# Patient Record
Sex: Male | Born: 1995 | Race: Black or African American | Hispanic: No | Marital: Single | Smoking: Never smoker
Health system: Southern US, Community
[De-identification: ages and names within clinical notes are randomized; demographics above are authoritative.]

## PROBLEM LIST (undated history)

## (undated) DIAGNOSIS — T7840XA Allergy, unspecified, initial encounter: Secondary | ICD-10-CM

## (undated) HISTORY — PX: COSMETIC SURGERY: SHX468

## (undated) HISTORY — DX: Allergy, unspecified, initial encounter: T78.40XA

---
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
## 2013-01-12 ENCOUNTER — Ambulatory Visit (INDEPENDENT_AMBULATORY_CARE_PROVIDER_SITE_OTHER): Payer: Federal, State, Local not specified - PPO | Admitting: Emergency Medicine

## 2013-01-12 VITALS — BP 120/64 | HR 53 | Temp 98.3°F | Resp 16 | Ht 67.0 in | Wt 140.0 lb

## 2013-01-12 DIAGNOSIS — S83206A Unspecified tear of unspecified meniscus, current injury, right knee, initial encounter: Secondary | ICD-10-CM

## 2013-01-12 DIAGNOSIS — IMO0002 Reserved for concepts with insufficient information to code with codable children: Secondary | ICD-10-CM

## 2013-01-12 DIAGNOSIS — J309 Allergic rhinitis, unspecified: Secondary | ICD-10-CM

## 2013-01-12 MED ORDER — OLOPATADINE HCL 0.1 % OP SOLN: 1.0000 [drp] | Freq: Two times a day (BID) | OPHTHALMIC | Status: DC

## 2013-01-12 MED ORDER — FLUTICASONE PROPIONATE 50 MCG/ACT NA SUSP: 4.0000 | Freq: Every day | NASAL | Status: DC

## 2013-01-12 NOTE — Progress Notes (Signed)
[facility] [address], [place] [address] [phone]  Date:  01/12/2013   Name:  Jeffery Murphy   DOB:  April 26, 1996   MRN:  [id number]  PCP:  No PCP Per Patient    Chief Complaint: Knee Pain and Eye Problem   History of Present Illness:  Jeffery Murphy is a 17 y.o. very pleasant male patient who presents with the following:  Injured his right knee and has experienced locking and clicking since September.  No overuse.  Has no swelling or bruising now.  No pain with ambulation.  Continues to skateboard.  Has nasal congestion and drainage that is mucoid as well as itching and watering in right eye.  No gluing.  No improvement with over the counter medications or other home remedies. Denies other complaint or health concern today.   There are no active problems to display for this patient.   History reviewed. No pertinent past medical history.  History reviewed. No pertinent past surgical history.  History  Substance Use Topics  . Smoking status: Never Smoker   . Smokeless tobacco: Not on file  . Alcohol Use: No    Family History  Problem Relation Age of Onset  . Diabetes Mother   . Asthma Father   . Asthma Brother     No Known Allergies  Medication list has been reviewed and updated.  No current outpatient prescriptions on file prior to visit.   No current facility-administered medications on file prior to visit.    Review of Systems:  As per HPI, otherwise negative.    Physical Examination: Filed Vitals:   01/12/13 1416  BP: 120/64  Pulse: 53  Temp: 98.3 F (36.8 C)  Resp: 16   Filed Vitals:   01/12/13 1416  Height: 5\' 7"  (1.702 m)  Weight: 140 lb (63.504 kg)   Body mass index is 21.92 kg/(m^2). Ideal Body Weight: Weight in (lb) to have BMI = 25: 159.3   GEN: WDWN, NAD, Non-toxic, Alert & Oriented x 3 HEENT: Atraumatic, Normocephalic. Conjunctiva clear.  No drainage or marginal exudate Ears and Nose: No external  deformity. EXTR: No clubbing/cyanosis/edema NEURO: Normal gait.  PSYCH: Normally interactive. Conversant. Not depressed or anxious appearing.  Calm demeanor.  Right knee:  jont stable.  No effusion.  No tenderness   Assessment and Plan: Allergic conjunctivitis Meniscus tear. Ortho referal flonase patanol   Signed,  Phillips Odor, MD

## 2013-01-12 NOTE — Patient Instructions (Addendum)

## 2013-03-13 ENCOUNTER — Ambulatory Visit (INDEPENDENT_AMBULATORY_CARE_PROVIDER_SITE_OTHER): Payer: Federal, State, Local not specified - PPO | Admitting: Family Medicine

## 2013-03-13 ENCOUNTER — Ambulatory Visit: Payer: Federal, State, Local not specified - PPO

## 2013-03-13 DIAGNOSIS — M79609 Pain in unspecified limb: Secondary | ICD-10-CM

## 2013-03-13 MED ORDER — ACETAMINOPHEN-CODEINE #3 300-30 MG PO TABS: 1.0000 | ORAL_TABLET | Freq: Four times a day (QID) | ORAL | Status: DC | PRN

## 2013-03-13 NOTE — Patient Instructions (Addendum)
Wear the splint as needed to protect your thumb.  If your pain is not better in the next couple of days please let me know- Sooner if worse.   The radiologist will also check your x-ray.  If they see any other problem I will call you right away  Keep your thumb clean and dry until tomorrow- then you can bathe as usual  Use the tylenol with codeine if needed for pain- remember it can make you sleepy

## 2013-03-13 NOTE — Progress Notes (Signed)
[facility] [address] [place] [address] [phone]  Date:  03/13/2013   Name:  Jeffery Murphy   DOB:  1995/09/29   MRN:  [id number]  PCP:  No PCP Per Patient    Chief Complaint: Hand Pain   History of Present Illness:  Jeffery Murphy is a 17 y.o. very pleasant male patient who presents with the following:  Shut his right thumb in a car sunroof last night.  He has pain and swelling, some blood under the thumb nail.   He is generally healthy Otherwise he is unhurt.  He notes that the fingers of his right hand feel a little tingly, but he did not injure these fingers.    No other medications.    He exercises quite a bit- plays sports and runs a lot Per pt and his father he was up most of the night last night with pain- he would like to have a stronger pain medication to have if needed   There are no active problems to display for this patient.   No past medical history on file.  No past surgical history on file.  History  Substance Use Topics  . Smoking status: Never Smoker   . Smokeless tobacco: Not on file  . Alcohol Use: No    Family History  Problem Relation Age of Onset  . Diabetes Mother   . Asthma Father   . Asthma Brother     No Known Allergies  Medication list has been reviewed and updated.  Current Outpatient Prescriptions on File Prior to Visit  Medication Sig Dispense Refill  . fluticasone (FLONASE) 50 MCG/ACT nasal spray Place 4 sprays into the nose daily.  16 g  12  . olopatadine (PATANOL) 0.1 % ophthalmic solution Place 1 drop into both eyes 2 (two) times daily.  5 mL  12   No current facility-administered medications on file prior to visit.    Review of Systems:  As per HPI- otherwise negative.   Physical Examination: Filed Vitals:   03/13/13 1227  BP: 106/56  Pulse: 47  Temp: 97.9 F (36.6 C)  Resp: 16   Filed Vitals:   03/13/13 1227  Height: 5' 6.25" (1.683 m)  Weight: 133 lb 6.4 oz (60.51 kg)   Body  mass index is 21.36 kg/(m^2). Ideal Body Weight: Weight in (lb) to have BMI = 25: 155.7  GEN: WDWN, NAD, Non-toxic, A & O x 3, generally healthy HEENT: Atraumatic, Normocephalic. Neck supple. No masses, No LAD.   Ears and Nose: No external deformity. CV: RRR, No M/G/R. No JVD. No thrill. No extra heart sounds. PULM: CTA B, no wheezes, crackles, rhonchi. No retractions. No resp. distress. No accessory muscle use. ABD: S, NT, ND, +BS. No rebound. No HSM. EXTR: No c/c/e NEURO Normal gait.  PSYCH: Normally interactive. Conversant. Not depressed or anxious appearing.  Calm demeanor.  Right hand: he has a 1/2 to 2/3 subungal hematoma of the right thumbnail.  The thumb is slightly swollen, but has normal ROM, normal flexion and extension, normal opposition Other fingers are normal, normal cap refill.   [facility] reading (PRIMARY) by  Dr. Patsy Lager. Right hand (thumb injury): no fracture noted  RIGHT HAND - COMPLETE 3+ VIEW  Comparison: None  Findings: None Osseous mineralization normal. Joint spaces preserved. No acute fracture, dislocation or bone destruction. Soft tissue swelling at right thumb.  IMPRESSION: No acute osseous abnormalities.  VC obtained.  Right thumbnail prepped with betadine.  Drained subungal hematoma with hand- held electrocautery.  No complications, pt tolerated well.  Placed in fold- over splint for protection    Assessment and Plan: Pain, hand, right - Plan: DG Hand Complete Right, acetaminophen-codeine (TYLENOL #3) 300-30 MG per tablet  subungual hematoma drained as above.  Placed in a fold- over splint for protection.  Let me know if not better in the next few days  Signed Abbe Amsterdam, MD

## 2013-05-04 ENCOUNTER — Ambulatory Visit (INDEPENDENT_AMBULATORY_CARE_PROVIDER_SITE_OTHER): Payer: Federal, State, Local not specified - PPO | Admitting: Physician Assistant

## 2013-05-04 VITALS — BP 112/72 | HR 78 | Temp 98.1°F | Resp 16 | Ht 66.0 in | Wt 131.2 lb

## 2013-05-04 DIAGNOSIS — R319 Hematuria, unspecified: Secondary | ICD-10-CM

## 2013-05-04 DIAGNOSIS — R350 Frequency of micturition: Secondary | ICD-10-CM

## 2013-05-04 LAB — POCT CBC
Granulocyte percent: 68.1 %G (ref 37–80)
Lymph, poc: 2.3 (ref 0.6–3.4)
MCV: 96.7 fL (ref 80–97)
POC MID %: 7.4 %M (ref 0–12)
Platelet Count, POC: 230 10*3/uL (ref 142–424)
RDW, POC: 14 %
WBC: 9.3 10*3/uL (ref 4.6–10.2)

## 2013-05-04 LAB — POCT URINALYSIS DIPSTICK
Glucose, UA: NEGATIVE
Nitrite, UA: NEGATIVE
Protein, UA: 100
Spec Grav, UA: >=1.03
Urobilinogen, UA: 0.2

## 2013-05-04 LAB — POCT UA - MICROSCOPIC ONLY
Bacteria, U Microscopic: NEGATIVE
Casts, Ur, LPF, POC: NEGATIVE
Crystals, Ur, HPF, POC: NEGATIVE
Yeast, UA: NEGATIVE

## 2013-05-04 LAB — GLUCOSE, POCT (MANUAL RESULT ENTRY): POC Glucose: 95 mg/dl (ref 70–99)

## 2013-05-04 MED ORDER — SULFAMETHOXAZOLE-TRIMETHOPRIM 800-160 MG PO TABS: 1.0000 | ORAL_TABLET | Freq: Two times a day (BID) | ORAL | Status: AC

## 2013-05-04 NOTE — Progress Notes (Signed)
Subjective:    Patient ID: Jeffery Murphy, male    DOB: 1996/06/17, 17 y.o.   MRN: [id number]  HPI  This 17 y.o. male presents for evaluation of dysuria and hematuria. Began 1-2 days ago, and had HA and chills. Urinary urgency and frequency. No penile discharge. Is not now, and never has been sexually active, including oral sex.  Previously similar symptoms about a year ago.  Used cranberry juice with resolution.  This time the symptoms are worse.    No nausea, vomiting or diarrhea. No myalgias, arthralgias or rash.  No abdominal, flank or back pain.  Review of Systems As above.    Objective:   Physical Exam Blood pressure 112/72, pulse 78, temperature 98.1 F (36.7 C), temperature source Oral, resp. rate 16, height 5\' 6"  (1.676 m), weight 131 lb 3.2 oz (59.512 kg), SpO2 100.00%. Body mass index is 21.19 kg/(m^2). Well-developed, well nourished BM who is awake, alert and oriented, in NAD. HEENT: Ward/AT, sclera and conjunctiva are clear.   Neck: supple, non-tender, no lymphadenopathy, thyromegaly. Heart: RRR, no murmur Lungs: normal effort, CTA Abdomen: normo-active bowel sounds, supple, non-tender, no mass or organomegaly. No CVA tenderness. No inguinal lymphadenopathy. Extremities: no cyanosis, clubbing or edema. Back:  Non-tender spine and paraspinous muscles. Skin: warm and dry without rash. Psychologic: good mood and appropriate affect, normal speech and behavior.    Results for orders placed in visit on 05/04/13  POCT UA - MICROSCOPIC ONLY      Result Value Range   WBC, Ur, HPF, POC 8-12     RBC, urine, microscopic tntc     Bacteria, U Microscopic neg     Mucus, UA neg     Epithelial cells, urine per micros 1-4     Crystals, Ur, HPF, POC neg     Casts, Ur, LPF, POC neg     Yeast, UA neg    POCT URINALYSIS DIPSTICK      Result Value Range   Color, UA yellow     Clarity, UA hazy     Glucose, UA neg     Bilirubin, UA neg     Ketones, UA trace     Spec Grav, UA >=1.030      Blood, UA large     pH, UA 6.0     Protein, UA 100     Urobilinogen, UA 0.2     Nitrite, UA neg     Leukocytes, UA small (1+)    POCT CBC      Result Value Range   WBC 9.3  4.6 - 10.2 K/uL   Lymph, poc 2.3  0.6 - 3.4   POC LYMPH PERCENT 24.5  10 - 50 %L   MID (cbc) 0.7  0 - 0.9   POC MID % 7.4  0 - 12 %M   POC Granulocyte 6.3  2 - 6.9   Granulocyte percent 68.1  37 - 80 %G   RBC 4.78  4.69 - 6.13 M/uL   Hemoglobin 14.8  14.1 - 18.1 g/dL   HCT, POC 56.2  13.0 - 53.7 %   MCV 96.7  80 - 97 fL   MCH, POC 31.0  27 - 31.2 pg   MCHC 32.0  31.8 - 35.4 g/dL   RDW, POC 86.5     Platelet Count, POC 230  142 - 424 K/uL   MPV 8.8  0 - 99.8 fL  GLUCOSE, POCT (MANUAL RESULT ENTRY)      Result Value Range  POC Glucose 95  70 - 99 mg/dl       Assessment & Plan:  Hematuria - Plan: POCT UA - Microscopic Only, POCT urinalysis dipstick, POCT CBC, Comprehensive metabolic panel, Urine culture  Frequent urination - Plan: POCT UA - Microscopic Only, POCT urinalysis dipstick, POCT glucose (manual entry), sulfamethoxazole-trimethoprim (BACTRIM DS,SEPTRA DS) 800-160 MG per tablet  Possible nephrolithiasis, but no back, flank or abdominal pain.  Treat for prostatitis, pending lab results. Rest, fluids.  RTC if symptoms not significantly improved in 48 hours, sooner if worsening.  Fernande Bras, PA-C Physician Assistant-Certified [facility]

## 2013-05-04 NOTE — Patient Instructions (Signed)
Get plenty of rest and drink at least 64 ounces of water daily.  I will contact you with your lab results as soon as they are available.   If you have not heard from me in 2 weeks, please contact me.  The fastest way to get your results is to register for My Chart (see the instructions on the last page of this printout).   

## 2013-05-05 LAB — COMPREHENSIVE METABOLIC PANEL
AST: 12 U/L (ref 0–37)
Albumin: 4.7 g/dL (ref 3.5–5.2)
Alkaline Phosphatase: 114 U/L (ref 39–117)
BUN: 15 mg/dL (ref 6–23)
Potassium: 4 mEq/L (ref 3.5–5.3)
Total Bilirubin: 2.7 mg/dL — ABNORMAL HIGH (ref 0.3–1.2)

## 2013-05-07 LAB — URINE CULTURE

## 2013-06-07 ENCOUNTER — Ambulatory Visit: Payer: Federal, State, Local not specified - PPO

## 2013-06-07 ENCOUNTER — Ambulatory Visit (INDEPENDENT_AMBULATORY_CARE_PROVIDER_SITE_OTHER): Payer: Federal, State, Local not specified - PPO | Admitting: Internal Medicine

## 2013-06-07 VITALS — BP 100/60 | HR 46 | Temp 98.5°F | Resp 16 | Ht 66.25 in | Wt 133.0 lb

## 2013-06-07 DIAGNOSIS — M25562 Pain in left knee: Secondary | ICD-10-CM

## 2013-06-07 DIAGNOSIS — M25569 Pain in unspecified knee: Secondary | ICD-10-CM

## 2013-06-07 DIAGNOSIS — IMO0002 Reserved for concepts with insufficient information to code with codable children: Secondary | ICD-10-CM

## 2013-06-07 DIAGNOSIS — S86912A Strain of unspecified muscle(s) and tendon(s) at lower leg level, left leg, initial encounter: Secondary | ICD-10-CM

## 2013-06-07 NOTE — Progress Notes (Signed)
  Subjective:    Patient ID: Jeffery Murphy, male    DOB: 10/27/1995, 17 y.o.   MRN: [id number]  HPI Skate boarder, left knee unable to jump, no pain. No remembered injury. No swelling except for osgood schlatters at tibial tuberosity. He states left leg is not as explosives as it used to be skateboarding when he jumps.   Review of Systems     Objective:   Physical Exam  Constitutional: He is oriented to person, place, and time. He appears well-developed and well-nourished.  HENT:  Head: Normocephalic.  Eyes: EOM are normal.  Cardiovascular: Normal rate.   Pulmonary/Chest: Effort normal.  Musculoskeletal:       Left knee: He exhibits normal range of motion, no swelling, no effusion, no ecchymosis, no deformity, no laceration, no erythema, normal alignment, no LCL laxity, normal patellar mobility, no bony tenderness, normal meniscus and no MCL laxity. No tenderness found. No medial joint line, no lateral joint line, no MCL and no LCL tenderness noted.  Neurological: He is alert and oriented to person, place, and time. He has normal strength and normal reflexes. No cranial nerve deficit or sensory deficit. He displays a negative Romberg sign.  Skin:     Prominent tibial tuberosity, not tender  Squats and rises no weakness or pain   [facility] reading (PRIMARY) by  Dr.Ahren Pettinger       Assessment & Plan:  Normal knee and leg exam Exercises to strenthen

## 2013-06-07 NOTE — Progress Notes (Signed)
  Subjective:    Patient ID: Jeffery Murphy, male    DOB: 18-Jun-1996, 17 y.o.   MRN: [id number]  HPI Patient comes in today with left knee pain with jumping. When he skateboards he is unable to jump due to pain. He does feel like his left knee gives out a little bit. Has never had a injury to the left knee but has noticed a lump.    Review of Systems     Objective:   Physical Exam        Assessment & Plan:

## 2013-06-07 NOTE — Patient Instructions (Addendum)
Knee Exercises EXERCISES RANGE OF MOTION(ROM) AND STRETCHING EXERCISES These exercises may help you when beginning to rehabilitate your injury. Your symptoms may resolve with or without further involvement from your physician, physical therapist or athletic trainer. While completing these exercises, remember:   Restoring tissue flexibility helps normal motion to return to the joints. This allows healthier, less painful movement and activity.  An effective stretch should be held for at least 30 seconds.  A stretch should never be painful. You should only feel a gentle lengthening or release in the stretched tissue. STRETCH - Knee Extension, Prone  Lie on your stomach on a firm surface, such as a bed or countertop. Place your right / left knee and leg just beyond the edge of the surface. You may wish to place a towel under the far end of your right / left thigh for comfort.  Relax your leg muscles and allow gravity to straighten your knee. Your clinician may advise you to add an ankle weight if more resistance is helpful for you.  You should feel a stretch in the back of your right / left knee. Hold this position for __________ seconds. Repeat __________ times. Complete this stretch __________ times per day. * Your physician, physical therapist or athletic trainer may ask you to add ankle weight to enhance your stretch.  RANGE OF MOTION - Knee Flexion, Active  Lie on your back with both knees straight. (If this causes back discomfort, bend your opposite knee, placing your foot flat on the floor.)  Slowly slide your heel back toward your buttocks until you feel a gentle stretch in the front of your knee or thigh.  Hold for __________ seconds. Slowly slide your heel back to the starting position. Repeat __________ times. Complete this exercise __________ times per day.  STRETCH - Quadriceps, Prone   Lie on your stomach on a firm surface, such as a bed or padded floor.  Bend your right /  left knee and grasp your ankle. If you are unable to reach, your ankle or pant leg, use a belt around your foot to lengthen your reach.  Gently pull your heel toward your buttocks. Your knee should not slide out to the side. You should feel a stretch in the front of your thigh and/or knee.  Hold this position for __________ seconds. Repeat __________ times. Complete this stretch __________ times per day.  STRETCH  Hamstrings, Supine   Lie on your back. Loop a belt or towel over the ball of your right / left foot.  Straighten your right / left knee and slowly pull on the belt to raise your leg. Do not allow the right / left knee to bend. Keep your opposite leg flat on the floor.  Raise the leg until you feel a gentle stretch behind your right / left knee or thigh. Hold this position for __________ seconds. Repeat __________ times. Complete this stretch __________ times per day.  STRENGTHENING EXERCISES These exercises may help you when beginning to rehabilitate your injury. They may resolve your symptoms with or without further involvement from your physician, physical therapist or athletic trainer. While completing these exercises, remember:   Muscles can gain both the endurance and the strength needed for everyday activities through controlled exercises.  Complete these exercises as instructed by your physician, physical therapist or athletic trainer. Progress the resistance and repetitions only as guided.  You may experience muscle soreness or fatigue, but the pain or discomfort you are trying to eliminate should   never worsen during these exercises. If this pain does worsen, stop and make certain you are following the directions exactly. If the pain is still present after adjustments, discontinue the exercise until you can discuss the trouble with your clinician. STRENGTH - Quadriceps, Isometrics  Lie on your back with your right / left leg extended and your opposite knee bent.  Gradually  tense the muscles in the front of your right / left thigh. You should see either your knee cap slide up toward your hip or increased dimpling just above the knee. This motion will push the back of the knee down toward the floor/mat/bed on which you are lying.  Hold the muscle as tight as you can without increasing your pain for __________ seconds.  Relax the muscles slowly and completely in between each repetition. Repeat __________ times. Complete this exercise __________ times per day.  STRENGTH - Quadriceps, Short Arcs   Lie on your back. Place a __________ inch towel roll under your knee so that the knee slightly bends.  Raise only your lower leg by tightening the muscles in the front of your thigh. Do not allow your thigh to rise.  Hold this position for __________ seconds. Repeat __________ times. Complete this exercise __________ times per day.  OPTIONAL ANKLE WEIGHTS: Begin with ____________________, but DO NOT exceed ____________________. Increase in 1 pound/0.5 kilogram increments.  STRENGTH - Quadriceps, Straight Leg Raises  Quality counts! Watch for signs that the quadriceps muscle is working to insure you are strengthening the correct muscles and not "cheating" by substituting with healthier muscles.  Lay on your back with your right / left leg extended and your opposite knee bent.  Tense the muscles in the front of your right / left thigh. You should see either your knee cap slide up or increased dimpling just above the knee. Your thigh may even quiver.  Tighten these muscles even more and raise your leg 4 to 6 inches off the floor. Hold for __________ seconds.  Keeping these muscles tense, lower your leg.  Relax the muscles slowly and completely in between each repetition. Repeat __________ times. Complete this exercise __________ times per day.  STRENGTH - Hamstring, Curls  Lay on your stomach with your legs extended. (If you lay on a bed, your feet may hang over the  edge.)  Tighten the muscles in the back of your thigh to bend your right / left knee up to 90 degrees. Keep your hips flat on the bed/floor.  Hold this position for __________ seconds.  Slowly lower your leg back to the starting position. Repeat __________ times. Complete this exercise __________ times per day.  OPTIONAL ANKLE WEIGHTS: Begin with ____________________, but DO NOT exceed ____________________. Increase in 1 pound/0.5 kilogram increments.  STRENGTH  Quadriceps, Squats  Stand in a door frame so that your feet and knees are in line with the frame.  Use your hands for balance, not support, on the frame.  Slowly lower your weight, bending at the hips and knees. Keep your lower legs upright so that they are parallel with the door frame. Squat only within the range that does not increase your knee pain. Never let your hips drop below your knees.  Slowly return upright, pushing with your legs, not pulling with your hands. Repeat __________ times. Complete this exercise __________ times per day.  STRENGTH - Quadriceps, Wall Slides  Follow guidelines for form closely. Increased knee pain often results from poorly placed feet or knees.  Lean against   a smooth wall or door and walk your feet out 18-24 inches. Place your feet hip-width apart.  Slowly slide down the wall or door until your knees bend __________ degrees.* Keep your knees over your heels, not your toes, and in line with your hips, not falling to either side.  Hold for __________ seconds. Stand up to rest for __________ seconds in between each repetition. Repeat __________ times. Complete this exercise __________ times per day. * Your physician, physical therapist or athletic trainer will alter this angle based on your symptoms and progress. Document Released: 06/29/2005 Document Revised: 11/07/2011 Document Reviewed: 11/27/2008 [facility] Patient Information 2014 [place], [place]. Patella Problems (Patellofemoral  Syndrome) This syndrome is caused by changes in the undersurface of the kneecap (patella). The changes vary from minor inflammation to major changes such as breakdown of the cartilage on the undersurface of the patella. The major changes can be seen with an arthroscope (a small, pencil-sized telescope). These changes can result from various factors. These factors may arise from abnormal tracking (movement or malalignment) of the patella. Normally the Patella is in its normal groove located between the condyles (grooved end) of the femur (thigh bone). Abnormal movement leads to increased pressure in the patellofemoral joint. This leads to swelling in the cartilage, inflammation and pain. SYMPTOMS  The patient with this syndrome usually has an ache in the knee. It is often aggravated by:  Prolonged sitting.  Squatting.  Climbing stairs.  Running down [place].  Other exercising that stresses the knee. Other findings may include the knee giving way, swelling, and or locking. TREATMENT  The treatment will depend on the cause of the problem. Sometimes the solution is as simple as cutting down on activities. Giving your joint a rest with the use of crutches and braces can also help. This is generally followed by strengthening exercises. RECOVERY Recovery from a patellar problem depends on the type of problem in your knee and on the treatment required. If conservative treatment works the recovery period may be as little as three to four weeks. If more aggressive therapy such as surgery is required, the recovery period may be several months. Your caregiver will discuss this with you. HOME CARE INSTRUCTIONS  Following exercise, use an ice pack for twenty to thirty minutes three to four times per day. Use a towel between your ice pack and the skin.  Reduction of inflammation with anti-inflammatories may be helpful. Only take over-the-counter or prescription medicines for pain, discomfort, or fever as  directed by your caregiver.  Taping the knee or using a neoprene sleeve with a patellar cutout to provide better tracking of the patella may give relief.  Muscle (quadriceps) strengthening exercises are helpful. Follow your caregiver's advice.  Muscle stretching prior to exercise may be helpful.  Soft tissue therapy using ultrasound, and diathermy may be helpful.  If conservative therapy is not effective, surgery may provide relief. During arthroscopy, your caregiver may discover a rough surface beneath your kneecap. If this happens, your caregiver may smooth this out by shaving the surface. SEEK MEDICAL CARE IF: If you have surgery, see your caregiver if:  There is increased bleeding or clear fluid (more than a small spot) from the wound.  You notice redness, swelling, or increasing pain in the wound.  Pus is coming from wound.  You develop an unexplained oral temperature above 102 F (38.9 C) develops, or as your caregiver suggests.  You notice a foul smell coming from the wound or dressing.  You develop  increasing pain or stiffness in your knee. SEEK IMMEDIATE MEDICAL CARE IF:   You develop a rash.  You have difficulty breathing.  You have any allergic problems. MAKE SURE YOU:   Understand these instructions.  Will watch your condition.  Will get help right away if you are not doing well or get worse. Document Released: 08/12/2000 Document Revised: 11/07/2011 Document Reviewed: 09/01/2008 [facility] Patient Information 2014 [place], [place].

## 2013-06-12 ENCOUNTER — Telehealth: Payer: Self-pay

## 2013-06-12 NOTE — Telephone Encounter (Signed)
Request given to xray 

## 2013-06-12 NOTE — Telephone Encounter (Signed)
PT WOULD LIKE TO COME BY AND P/U A COPY OF HIS XRAYS, NEED BY Monday. PLEASE CALL [phone] WHEN READY

## 2013-09-03 ENCOUNTER — Ambulatory Visit (INDEPENDENT_AMBULATORY_CARE_PROVIDER_SITE_OTHER): Payer: Federal, State, Local not specified - PPO | Admitting: Family Medicine

## 2013-09-03 VITALS — BP 100/60 | HR 78 | Temp 98.4°F | Resp 16 | Ht 67.0 in | Wt 132.0 lb

## 2013-09-03 DIAGNOSIS — J029 Acute pharyngitis, unspecified: Secondary | ICD-10-CM

## 2013-09-03 DIAGNOSIS — R6883 Chills (without fever): Secondary | ICD-10-CM

## 2013-09-03 LAB — POCT RAPID STREP A (OFFICE): Rapid Strep A Screen: NEGATIVE

## 2013-09-03 LAB — POCT INFLUENZA A/B
Influenza A, POC: NEGATIVE
Influenza B, POC: NEGATIVE

## 2013-09-03 NOTE — Progress Notes (Signed)
[facility] [address], [place] [address] [phone]  Date:  09/03/2013   Name:  Jeffery Murphy   DOB:  [birth year]   MRN:  [id number]  PCP:  No PCP Per Patient    Chief Complaint: Chills and Sore Throat   History of Present Illness:  Jeffery Murphy is a 18 y.o. very pleasant male patient who presents with the following:  Here today with illness. He noted a mild ST yesterday, and it seemed to get worse today.   He also had chills last night.   He felt achy today.   They have not noted any fever at home.   No cough, he has noted a stuffy and runny nose.   No sick contacts that he knows of No Gi symptoms    He has taken some cough syrup today.    He is generally healthy .   He works as a Copy.   Bp is usually low- 7/14 106/56, and 05/2013 100/60  There are no active problems to display for this patient.   Past Medical History  Diagnosis Date  . Allergy     Past Surgical History  Procedure Laterality Date  . Cosmetic surgery      jaw repair    History  Substance Use Topics  . Smoking status: Never Smoker   . Smokeless tobacco: Never Used  . Alcohol Use: No    Family History  Problem Relation Age of Onset  . Diabetes Mother   . Asthma Father   . Asthma Brother     No Known Allergies  Medication list has been reviewed and updated.  Current Outpatient Prescriptions on File Prior to Visit  Medication Sig Dispense Refill  . fluticasone (FLONASE) 50 MCG/ACT nasal spray Place 4 sprays into the nose daily.  16 g  12   No current facility-administered medications on file prior to visit.    Review of Systems:  As per HPI- otherwise negative.   Physical Examination: Filed Vitals:   09/03/13 1909  BP: 100/60  Pulse: 78  Temp: 98.4 F (36.9 C)  Resp: 16   Filed Vitals:   09/03/13 1909  Height: 5\' 7"  (1.702 m)  Weight: 132 lb (59.875 kg)   Body mass index is 20.67 kg/(m^2). Ideal Body Weight: Weight in (lb) to have  BMI = 25: 159.3  GEN: WDWN, NAD, Non-toxic, A & O x 3, looks well HEENT: Atraumatic, Normocephalic. Neck supple. No masses, No LAD.  Bilateral TM wnl, oropharynx normal.  PEERL,EOMI.   Ears and Nose: No external deformity. CV: RRR, No M/G/R. No JVD. No thrill. No extra heart sounds. PULM: CTA B, no wheezes, crackles, rhonchi. No retractions. No resp. distress. No accessory muscle use. ABD: S, NT, ND. No rebound. No HSM. EXTR: No c/c/e NEURO Normal gait.  PSYCH: Normally interactive. Conversant. Not depressed or anxious appearing.  Calm demeanor.   Results for orders placed in visit on 09/03/13  POCT INFLUENZA A/B      Result Value Range   Influenza A, POC Negative     Influenza B, POC Negative    POCT RAPID STREP A (OFFICE)      Result Value Range   Rapid Strep A Screen Negative  Negative    Assessment and Plan: Acute pharyngitis - Plan: POCT rapid strep A, Culture, Group A Strep  Chills - Plan: POCT Influenza A/B, Culture, Group A Strep  Viral URI.  Supportive care, use OTC medication  as needed.  Let me know if not better soon- Sooner if worse.     Signed Abbe AmsterdamJessica Copland, MD

## 2013-09-03 NOTE — Patient Instructions (Signed)
It looks like you have a viral illness.  Try using OTC medications such as ibuprofen or tylenol.  You can also use OTC cough medication like delsym. Let me know if you are not better soon- Sooner if worse.

## 2013-09-06 LAB — CULTURE, GROUP A STREP: Organism ID, Bacteria: NORMAL

## 2014-03-20 ENCOUNTER — Ambulatory Visit (INDEPENDENT_AMBULATORY_CARE_PROVIDER_SITE_OTHER): Payer: Federal, State, Local not specified - PPO | Admitting: Physician Assistant

## 2014-03-20 VITALS — BP 118/68 | HR 74 | Temp 98.9°F | Resp 16 | Ht 66.25 in | Wt 139.8 lb

## 2014-03-20 DIAGNOSIS — M25571 Pain in right ankle and joints of right foot: Secondary | ICD-10-CM

## 2014-03-20 DIAGNOSIS — S93409A Sprain of unspecified ligament of unspecified ankle, initial encounter: Secondary | ICD-10-CM

## 2014-03-20 DIAGNOSIS — M25579 Pain in unspecified ankle and joints of unspecified foot: Secondary | ICD-10-CM

## 2014-03-20 MED ORDER — MELOXICAM 15 MG PO TABS: 15.0000 mg | ORAL_TABLET | Freq: Every day | ORAL | Status: DC

## 2014-03-20 NOTE — Progress Notes (Signed)
   Subjective:    Patient ID: Jeffery Murphy, male    DOB: 02/28/1996, 18 y.o.   MRN: [id number]  HPI 18 year old male presents for evaluation of right ankle pain s/p an ankle injury on 7/20. He was riding his skateboard and had an eversion injury.  Continues to have pain when he tries to make quick lateral movements or run up stairs. Admits it has improved somewhat and he is able to walk without pain. He has taken Aleve intermittently which does seem to help somewhat.  No paresthesias, weakness, or decreased ROM.  No hx of prior fractures.     Review of Systems  Musculoskeletal: Positive for arthralgias. Negative for joint swelling.  Skin: Negative for color change.  Neurological: Negative for weakness and numbness.       Objective:   Physical Exam  Constitutional: He is oriented to person, place, and time. He appears well-developed and well-nourished.  HENT:  Head: Normocephalic and atraumatic.  Right Ear: External ear normal.  Left Ear: External ear normal.  Eyes: Conjunctivae are normal.  Neck: Normal range of motion.  Cardiovascular: Normal rate and intact distal pulses.   Pulses:      Dorsalis pedis pulses are 2+ on the right side, and 2+ on the left side.  Capillary refill normal <2 seconds  Pulmonary/Chest: Effort normal.  Musculoskeletal:       Right ankle: He exhibits normal range of motion, no swelling, no ecchymosis and normal pulse. No tenderness. No lateral malleolus, no medial malleolus and no AITFL tenderness found. Achilles tendon normal.  5/5 strength dorsiflexion/plantar flexion  Neurological: He is alert and oriented to person, place, and time.  Psychiatric: He has a normal mood and affect. His behavior is normal. Judgment and thought content normal.          Assessment & Plan:  Sprain of ankle, unspecified site  Pain in joint, ankle and foot, right  18 year old healthy male with a right ankle sprain that seems to be improving. Exam normal today.  Provided him with reassurance and discussed that it may take another week for him to return to normal level of activities. Placed in sweedo to wear for comfort and support. Start mobic 15 mg daily with food. Recheck in 7 days if symptoms fail to improve, sooner if worse.

## 2014-03-24 NOTE — Addendum Note (Signed)
Addended by: Nelva NayMARTE, Yakira Duquette M on: 03/24/2014 11:17 AM   Modules accepted: Level of Service

## 2014-11-19 ENCOUNTER — Emergency Department (HOSPITAL_COMMUNITY)
Admission: EM | Admit: 2014-11-19 | Discharge: 2014-11-19 | Disposition: A | Discharge status: Home / Self Care | Payer: Federal, State, Local not specified - PPO | Attending: Emergency Medicine | Admitting: Emergency Medicine

## 2014-11-19 DIAGNOSIS — Z791 Long term (current) use of non-steroidal anti-inflammatories (NSAID): Secondary | ICD-10-CM | POA: Diagnosis not present

## 2014-11-19 DIAGNOSIS — R590 Localized enlarged lymph nodes: Secondary | ICD-10-CM | POA: Diagnosis present

## 2014-11-19 DIAGNOSIS — Z7951 Long term (current) use of inhaled steroids: Secondary | ICD-10-CM | POA: Insufficient documentation

## 2014-11-19 DIAGNOSIS — Z872 Personal history of diseases of the skin and subcutaneous tissue: Secondary | ICD-10-CM | POA: Insufficient documentation

## 2014-11-19 LAB — URINALYSIS, ROUTINE W REFLEX MICROSCOPIC
BILIRUBIN URINE: NEGATIVE
Glucose, UA: NEGATIVE mg/dL
Hgb urine dipstick: NEGATIVE
KETONES UR: NEGATIVE mg/dL
LEUKOCYTES UA: NEGATIVE
NITRITE: NEGATIVE
PH: 6 (ref 5.0–8.0)
PROTEIN: NEGATIVE mg/dL
Specific Gravity, Urine: 1.033 — ABNORMAL HIGH (ref 1.005–1.030)
UROBILINOGEN UA: 1 mg/dL (ref 0.0–1.0)

## 2014-11-19 MED ORDER — DOXYCYCLINE HYCLATE 100 MG PO TABS: 100.0000 mg | ORAL_TABLET | Freq: Two times a day (BID) | ORAL | Status: DC

## 2014-11-19 NOTE — ED Provider Notes (Signed)
CSN: [id number]     Arrival date & time 11/19/14  0023 History   First MD Initiated Contact with Patient 11/19/14 0120     Chief Complaint  Patient presents with  . Lymphadenopathy     (Consider location/radiation/quality/duration/timing/severity/associated sxs/prior Treatment) HPI  Patient reports he has had abscesses in his axilla in the past that drained on their own. He states he's been having some painful swollen lymph nodes in his groin "for a while" which he states was a few months. He states however they come and go. Takes however yesterday he did not have any swelling and today he noted swelling in his groin. He denies any trouble urinating, dysuria, frequency, or penile drip. He denies any fever. He states he's been having chills off and on today without fever documented.  PCP none  Past Medical History  Diagnosis Date  . Allergy    Past Surgical History  Procedure Laterality Date  . Cosmetic surgery      jaw repair   Family History  Problem Relation Age of Onset  . Diabetes Mother   . Asthma Father   . Asthma Brother    History  Substance Use Topics  . Smoking status: Never Smoker   . Smokeless tobacco: Never Used  . Alcohol Use: No  college student  Review of Systems  All other systems reviewed and are negative.     Allergies  Review of patient's allergies indicates no known allergies.  Home Medications   Prior to Admission medications   Medication Sig Start Date End Date Taking? Authorizing Provider  doxycycline (VIBRA-TABS) 100 MG tablet Take 1 tablet (100 mg total) by mouth 2 (two) times daily. 11/19/14   Devoria Albe, MD  fluticasone (FLONASE) 50 MCG/ACT nasal spray Place 4 sprays into the nose daily. 01/12/13   Carmelina Dane, MD  meloxicam (MOBIC) 15 MG tablet Take 1 tablet (15 mg total) by mouth daily. 03/20/14   Heather M Marte, PA-C   BP 127/75 mmHg  Pulse 49  Temp(Src) 98 F (36.7 C) (Oral)  Resp 20  Ht  (1.676 m)  Wt 138 lb (62.596  kg)  BMI 22.28 kg/m2  SpO2 100%  Vital signs normal except bradycardia  Physical Exam  Constitutional: He is oriented to person, place, and time. He appears well-developed and well-nourished.  Non-toxic appearance. He does not appear ill. No distress.  HENT:  Head: Normocephalic and atraumatic.  Right Ear: External ear normal.  Left Ear: External ear normal.  Nose: Nose normal. No mucosal edema or rhinorrhea.  Mouth/Throat: Oropharynx is clear and moist and mucous membranes are normal. No dental abscesses or uvula swelling.  Eyes: Conjunctivae and EOM are normal. Pupils are equal, round, and reactive to light.  Neck: Normal range of motion and full passive range of motion without pain. Neck supple.  Pulmonary/Chest: Effort normal. No respiratory distress. He has no rhonchi. He exhibits no crepitus.  Abdominal: Soft. Normal appearance and bowel sounds are normal. He exhibits no distension. There is no tenderness. There is no rebound and no guarding.  Genitourinary:  Patient is noted to have bilateral shotty inguinal lymph nodes. He has a subcutaneous nodule in the groin area and a subcutaneous nodule in the skin where his penis joins the scrotum. There are no reddened areas. There are no post chills seen. There is no penile drip seen.  Musculoskeletal: Normal range of motion. He exhibits no edema or tenderness.  Moves all extremities well.   Neurological: He  is alert and oriented to person, place, and time. He has normal strength. No cranial nerve deficit.  Skin: Skin is warm, dry and intact. No rash noted. No erythema. No pallor.  Psychiatric: He has a normal mood and affect. His speech is normal and behavior is normal. His mood appears not anxious.  Nursing note and vitals reviewed.   ED Course  Procedures (including critical care time)    Labs Review Results for orders placed or performed during the hospital encounter of 11/19/14  Urinalysis, Routine w reflex microscopic  Result  Value Ref Range   Color, Urine YELLOW YELLOW   APPearance CLEAR CLEAR   Specific Gravity, Urine 1.033 (H) 1.005 - 1.030   pH 6.0 5.0 - 8.0   Glucose, UA NEGATIVE NEGATIVE mg/dL   Hgb urine dipstick NEGATIVE NEGATIVE   Bilirubin Urine NEGATIVE NEGATIVE   Ketones, ur NEGATIVE NEGATIVE mg/dL   Protein, ur NEGATIVE NEGATIVE mg/dL   Urobilinogen, UA 1.0 0.0 - 1.0 mg/dL   Nitrite NEGATIVE NEGATIVE   Leukocytes, UA NEGATIVE NEGATIVE   Laboratory interpretation all normal except concentrated urine      Imaging Review No results found.   EKG Interpretation None      MDM   Final diagnoses:  Inguinal lymphadenopathy   Discharge Medication List as of 11/19/2014  2:48 AM    START taking these medications   Details  doxycycline (VIBRA-TABS) 100 MG tablet Take 1 tablet (100 mg total) by mouth 2 (two) times daily., Starting 11/19/2014, Until Discontinued, Print        Plan discharge  Devoria AlbeIva Braylon Lemmons, MD, Concha PyoFACEP      Glenna Brunkow, MD 11/19/14 [phone]

## 2014-11-19 NOTE — Discharge Instructions (Signed)
Take the antibiotics until gone. Recheck if the swollen areas get more swollen, more painful, or you start having drainage.

## 2014-11-19 NOTE — ED Notes (Signed)
Pt states that this morning he noticed he had swollen lymph nodes in his groin. Also reports chills. Alert and oriented.

## 2015-10-04 ENCOUNTER — Ambulatory Visit (INDEPENDENT_AMBULATORY_CARE_PROVIDER_SITE_OTHER): Payer: Federal, State, Local not specified - PPO | Admitting: Physician Assistant

## 2015-10-04 DIAGNOSIS — S01512A Laceration without foreign body of oral cavity, initial encounter: Secondary | ICD-10-CM | POA: Diagnosis not present

## 2015-10-04 MED ORDER — AMOXICILLIN-POT CLAVULANATE 875-125 MG PO TABS: 1.0000 | ORAL_TABLET | Freq: Two times a day (BID) | ORAL | Status: DC

## 2015-10-04 MED ORDER — CARBAMIDE PEROXIDE 10 % MT SOLN: 1.0000 "application " | Freq: Four times a day (QID) | OROMUCOSAL | Status: DC | PRN

## 2015-10-04 NOTE — Progress Notes (Signed)
   10/05/2015 12:32 PM   DOB: [birth year] / MRN: [id number]  SUBJECTIVE:  Jeffery Murphy is a 20 y.o. male presenting for an internal lower right left lip laceration sustained during a fall during skate boarding.  He denies severe pain and tenderness.  This happened yesterday and he has not tried anything for the wound.   He has No Known Allergies.   He  has a past medical history of Allergy.    He  reports that he has never smoked. He has never used smokeless tobacco. He reports that he does not drink alcohol or use illicit drugs. He  reports that he does not engage in sexual activity. The patient  has past surgical history that includes Cosmetic surgery.  His family history includes Asthma in his brother and father; Diabetes in his mother.  Review of Systems  Constitutional: Negative for fever and chills.  Eyes: Negative for blurred vision.  Respiratory: Negative for cough and shortness of breath.   Cardiovascular: Negative for chest pain.  Gastrointestinal: Negative for nausea and abdominal pain.  Genitourinary: Negative for dysuria, urgency and frequency.  Musculoskeletal: Negative for myalgias.  Skin: Negative for rash.  Neurological: Negative for dizziness, tingling and headaches.  Psychiatric/Behavioral: Negative for depression. The patient is not nervous/anxious.     Problem list and medications reviewed and updated by myself where necessary, and exist elsewhere in the encounter.   OBJECTIVE:  BP 112/74 mmHg  Pulse 64  Temp(Src) 98.4 F (36.9 C) (Oral)  Resp 16  Ht  (1.702 m)  Wt 142 lb 6.4 oz (64.592 kg)  BMI 22.30 kg/m2  SpO2 98%  Physical Exam  Constitutional: He is oriented to person, place, and time. He appears well-developed. He does not appear ill.  HENT:  2.5 cm right lower lip abrasion.  The vermilion border is uninvolved.    Eyes: Conjunctivae and EOM are normal. Pupils are equal, round, and reactive to light.  Cardiovascular: Normal rate.     Pulmonary/Chest: Effort normal.  Abdominal: He exhibits no distension.  Musculoskeletal: Normal range of motion.  Neurological: He is alert and oriented to person, place, and time. No cranial nerve deficit. Coordination normal.  Skin: Skin is warm and dry. He is not diaphoretic.  Psychiatric: He has a normal mood and affect.  Nursing note and vitals reviewed.   No results found for this or any previous visit (from the past 72 hour(s)).  No results found.  ASSESSMENT AND PLAN  [name] was seen today for lip laceration.  Diagnoses and all orders for this visit:  Laceration of mouth, initial encounter: No alarm features.  Will treat symptomatically.  -     carbamide peroxide (GLY-OXIDE) 10 % solution; Place 1 application onto teeth 4 (four) times daily as needed    The patient was advised to call or return to clinic if he does not see an improvement in symptoms or to seek the care of the closest emergency department if he worsens with the above plan.   Deliah Boston, MHS, PA-C [facility] 10/05/2015 12:32 PM

## 2018-06-15 ENCOUNTER — Encounter (HOSPITAL_COMMUNITY): Payer: Self-pay | Admitting: Family Medicine

## 2018-06-15 ENCOUNTER — Ambulatory Visit (HOSPITAL_COMMUNITY)
Admission: EM | Admit: 2018-06-15 | Discharge: 2018-06-15 | Disposition: A | Discharge status: Home / Self Care | Payer: Federal, State, Local not specified - PPO | Attending: Family Medicine | Admitting: Family Medicine

## 2018-06-15 DIAGNOSIS — S0093XA Contusion of unspecified part of head, initial encounter: Secondary | ICD-10-CM

## 2018-06-15 DIAGNOSIS — S0181XA Laceration without foreign body of other part of head, initial encounter: Secondary | ICD-10-CM

## 2018-06-15 DIAGNOSIS — S01512A Laceration without foreign body of oral cavity, initial encounter: Secondary | ICD-10-CM

## 2018-06-15 DIAGNOSIS — Z23 Encounter for immunization: Secondary | ICD-10-CM | POA: Diagnosis not present

## 2018-06-15 MED ORDER — CHLORHEXIDINE GLUCONATE 0.12 % MT SOLN: 15.0000 mL | Freq: Two times a day (BID) | OROMUCOSAL | 0 refills | Status: DC

## 2018-06-15 MED ORDER — TETANUS-DIPHTH-ACELL PERTUSSIS 5-2.5-18.5 LF-MCG/0.5 IM SUSP
INTRAMUSCULAR | Status: AC
  Filled 2018-06-15: qty 0.5

## 2018-06-15 MED ORDER — LIDOCAINE-EPINEPHRINE (PF) 2 %-1:200000 IJ SOLN
INTRAMUSCULAR | Status: AC
  Filled 2018-06-15: qty 20

## 2018-06-15 MED ORDER — TETANUS-DIPHTH-ACELL PERTUSSIS 5-2.5-18.5 LF-MCG/0.5 IM SUSP
0.5000 mL | Freq: Once | INTRAMUSCULAR | Status: AC
  Administered 2018-06-15: 0.5 mL via INTRAMUSCULAR

## 2018-06-15 NOTE — ED Triage Notes (Signed)
Pt presents with some face abrasions and tongue laceration.
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
## 2018-06-15 NOTE — Discharge Instructions (Addendum)
Return in 5-6 days for removal of sutures.

## 2018-06-18 ENCOUNTER — Ambulatory Visit (HOSPITAL_COMMUNITY)
Admission: EM | Admit: 2018-06-18 | Discharge: 2018-06-18 | Disposition: A | Discharge status: Home / Self Care | Payer: Federal, State, Local not specified - PPO | Attending: Family Medicine | Admitting: Family Medicine

## 2018-06-18 ENCOUNTER — Encounter (HOSPITAL_COMMUNITY): Payer: Self-pay | Admitting: Emergency Medicine

## 2018-06-18 DIAGNOSIS — K0889 Other specified disorders of teeth and supporting structures: Secondary | ICD-10-CM | POA: Diagnosis not present

## 2018-06-18 DIAGNOSIS — K1379 Other lesions of oral mucosa: Secondary | ICD-10-CM | POA: Diagnosis not present

## 2018-06-18 DIAGNOSIS — S01512D Laceration without foreign body of oral cavity, subsequent encounter: Secondary | ICD-10-CM

## 2018-06-18 MED ORDER — TRAMADOL HCL 50 MG PO TABS: 50.0000 mg | ORAL_TABLET | Freq: Four times a day (QID) | ORAL | 0 refills | Status: DC | PRN

## 2018-06-18 MED ORDER — KETOROLAC TROMETHAMINE 30 MG/ML IJ SOLN
INTRAMUSCULAR | Status: AC
  Filled 2018-06-18: qty 1

## 2018-06-18 MED ORDER — KETOROLAC TROMETHAMINE 30 MG/ML IJ SOLN
30.0000 mg | Freq: Once | INTRAMUSCULAR | Status: AC
  Administered 2018-06-18: 30 mg via INTRAMUSCULAR
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
## 2018-06-18 NOTE — Discharge Instructions (Addendum)
It was nice meeting you!!  We will give you a few pain pills that are little stronger until you can see your dentist on Wednesday They may want to do some x rays.  Continue using the Peridex 2 x a day

## 2018-06-18 NOTE — ED Triage Notes (Signed)
Pt c/o gum pain since his last visit here 3 days ago. Pt thinks he hit the steering wheel with his face. Pt c/o dental pain on the top part of his teeth.

## 2018-12-08 ENCOUNTER — Telehealth: Payer: Federal, State, Local not specified - PPO | Admitting: Nurse Practitioner

## 2018-12-08 DIAGNOSIS — R6889 Other general symptoms and signs: Principal | ICD-10-CM

## 2018-12-08 DIAGNOSIS — Z20822 Contact with and (suspected) exposure to covid-19: Secondary | ICD-10-CM

## 2018-12-08 NOTE — Progress Notes (Signed)
I future if you need to reply to your evisit you will get a quicker response if you reply to evisit it self instead of sending message to provider. Evisit are checked more often then providers individual in basket. So for timely response just reply to evisit.  I have written you an out of work note. It is in your my chart. Hope you feel better soon.

## 2018-12-08 NOTE — Progress Notes (Signed)
E-Visit for Corona Virus Screening  Based on your current symptoms, you may very well have the virus, however your symptoms are mild. Currently, not all patients are being tested. If the symptoms are mild and there is not a known exposure, performing the test is not indicated.  Coronavirus disease 2019 (COVID-19) is a respiratory illness that can spread from person to person. The virus that causes COVID-19 is a new virus that was first identified in the country of [place] but is now found in multiple other countries and has spread to the [place].  Symptoms associated with the virus are mild to severe fever, cough, and shortness of breath. There is currently no vaccine to protect against COVID-19, and there is no specific antiviral treatment for the virus.   To be considered HIGH RISK for Coronavirus (COVID-19), you have to meet the following criteria:  . Traveled to [place], [place], [place], [place] or [place]; or in the [place] to [place], [place], [place], or [place]; and have fever, cough, and shortness of breath within the last 2 weeks of travel OR  . Been in close contact with a person diagnosed with COVID-19 within the last 2 weeks and have fever, cough, and shortness of breath  . IF YOU DO NOT MEET THESE CRITERIA, YOU ARE CONSIDERED LOW RISK FOR COVID-19.   It is vitally important that if you feel that you have an infection such as this virus or any other virus that you stay home and away from places where you may spread it to others.  You should self-quarantine for 14 days if you have symptoms that could potentially be coronavirus and avoid contact with people age 41 and older.   You can use medication such as delsym or mucinex OTC for cough  You may also take acetaminophen (Tylenol) as needed for fever.   Reduce your risk of any infection by using the same precautions used for avoiding the common cold or flu:  Marland Kitchen Wash your hands often with soap and warm water for at least  20 seconds.  If soap and water are not readily available, use an alcohol-based hand sanitizer with at least 60% alcohol.  . If coughing or sneezing, cover your mouth and nose by coughing or sneezing into the elbow areas of your shirt or coat, into a tissue or into your sleeve (not your hands). . Avoid shaking hands with others and consider head nods or verbal greetings only. . Avoid touching your eyes, nose, or mouth with unwashed hands.  . Avoid close contact with people who are sick. . Avoid places or events with large numbers of people in one location, like concerts or sporting events. . Carefully consider travel plans you have or are making. . If you are planning any travel outside or inside the [place], visit the CDC's Travelers' Health webpage for the latest health notices. . If you have some symptoms but not all symptoms, continue to monitor at home and seek medical attention if your symptoms worsen. . If you are having a medical emergency, call 911.  HOME CARE . Only take medications as instructed by your medical team. . Drink plenty of fluids and get plenty of rest. . A steam or ultrasonic humidifier can help if you have congestion.   GET HELP RIGHT AWAY IF: . You develop worsening fever. . You become short of breath . You cough up blood. . Your symptoms become more severe MAKE SURE YOU   Understand these  instructions.  Will watch your condition.  Will get help right away if you are not doing well or get worse.  Your e-visit answers were reviewed by a board certified advanced clinical practitioner to complete your personal care plan.  Depending on the condition, your plan could have included both over the counter or prescription medications.  If there is a problem please reply once you have received a response from your provider. Your safety is important to [place].  If you have drug allergies check your prescription carefully.    You can use MyChart to ask questions about today's  visit, request a non-urgent call back, or ask for a work or school excuse for 24 hours related to this e-Visit. If it has been greater than 24 hours you will need to follow up with your provider, or enter a new e-Visit to address those concerns. You will get an e-mail in the next two days asking about your experience.  I hope that your e-visit has been valuable and will speed your recovery. Thank you for using e-visits.  6 minutes spent reviewing and documenting in chart.

## 2020-07-27 ENCOUNTER — Ambulatory Visit (INDEPENDENT_AMBULATORY_CARE_PROVIDER_SITE_OTHER): Payer: Federal, State, Local not specified - PPO

## 2020-07-27 ENCOUNTER — Ambulatory Visit (HOSPITAL_COMMUNITY)
Admission: EM | Admit: 2020-07-27 | Discharge: 2020-07-27 | Disposition: A | Discharge status: Home / Self Care | Payer: Federal, State, Local not specified - PPO | Attending: Family Medicine | Admitting: Family Medicine

## 2020-07-27 ENCOUNTER — Other Ambulatory Visit: Payer: Self-pay

## 2020-07-27 ENCOUNTER — Encounter (HOSPITAL_COMMUNITY): Payer: Self-pay

## 2020-07-27 DIAGNOSIS — K5900 Constipation, unspecified: Secondary | ICD-10-CM | POA: Diagnosis not present

## 2020-07-27 DIAGNOSIS — R109 Unspecified abdominal pain: Secondary | ICD-10-CM

## 2020-07-27 DIAGNOSIS — R1011 Right upper quadrant pain: Secondary | ICD-10-CM

## 2020-07-27 DIAGNOSIS — N5089 Other specified disorders of the male genital organs: Secondary | ICD-10-CM

## 2020-07-27 DIAGNOSIS — N50819 Testicular pain, unspecified: Secondary | ICD-10-CM

## 2020-07-27 LAB — POCT URINALYSIS DIPSTICK, ED / UC
Bilirubin Urine: NEGATIVE
Glucose, UA: NEGATIVE mg/dL
Hgb urine dipstick: NEGATIVE
Ketones, ur: NEGATIVE mg/dL
Leukocytes,Ua: NEGATIVE
Nitrite: NEGATIVE
Protein, ur: NEGATIVE mg/dL
Specific Gravity, Urine: 1.02 (ref 1.005–1.030)
Urobilinogen, UA: 1 mg/dL (ref 0.0–1.0)
pH: 8.5 — ABNORMAL HIGH (ref 5.0–8.0)

## 2020-07-27 NOTE — ED Triage Notes (Signed)
Pt presents with abdominal pain right under right area; pt also complains of testicular pain X 4 days.

## 2020-07-27 NOTE — Discharge Instructions (Addendum)
Your x ray showed a moderate amount of stool. I believe that you are constipated. We will have you do miralax daily until you get a good bowel movement and drink lots of water.   I believe that the abnormality in the testicle is most likely a cyst.  We will send you to urology for ultrasound I am also can you contact your primary care for follow-up

## 2020-07-28 NOTE — ED Provider Notes (Signed)
[facility]    CSN: [id number] Arrival date & time: 07/27/20  1108      History   Chief Complaint Chief Complaint  Patient presents with  . Abdominal Pain  . Testicular Pain    HPI Hunt ABDALRAHMAN CLEMENTSON is a 24 y.o. male.   Patient is a 24 year old male who presents today with multiple complaints.  First complaint being some intermittent right upper quadrant discomfort.  This is been present for the past 3 to 4 days.  No associated nausea, vomiting.  Reported normal bowel movements.  No diarrhea.  No fevers.  He is noticed a right testicular abnormality.  Reporting palpated nodule in testicle a few days ago.  Denies any pain in testicle.  Denies any swelling, penile discharge, dysuria, hematuria or urinary frequency.  Denies any concern for STDs     Past Medical History:  Diagnosis Date  . Allergy     There are no problems to display for this patient.   Past Surgical History:  Procedure Laterality Date  . COSMETIC SURGERY     jaw repair       Home Medications    Prior to Admission medications   Medication Sig Start Date End Date Taking? Authorizing Provider  fluticasone (FLONASE) 50 MCG/ACT nasal spray Place 4 sprays into the nose daily. 01/12/13 07/27/20  Carmelina Dane, MD    Family History Family History  Problem Relation Age of Onset  . Diabetes Mother   . Asthma Father   . Asthma Brother     Social History Social History   Tobacco Use  . Smoking status: Never Smoker  . Smokeless tobacco: Never Used  Substance Use Topics  . Alcohol use: No  . Drug use: No     Allergies   Patient has no known allergies.   Review of Systems Review of Systems   Physical Exam Triage Vital Signs ED Triage Vitals  Enc Vitals Group     BP 07/27/20 1250 118/78     Pulse Rate 07/27/20 1250 61     Resp 07/27/20 1250 17     Temp 07/27/20 1250 98.4 F (36.9 C)     Temp Source 07/27/20 1250 Oral     SpO2 07/27/20 1250 98 %     Weight --       Height --      Head Circumference --      Peak Flow --      Pain Score 07/27/20 1248 5     Pain Loc --      Pain Edu? --      Excl. in GC? --    No data found.  Updated Vital Signs BP 118/78 (BP Location: Right Arm)   Pulse 61   Temp 98.4 F (36.9 C) (Oral)   Resp 17   SpO2 98%   Visual Acuity Right Eye Distance:   Left Eye Distance:   Bilateral Distance:    Right Eye Near:   Left Eye Near:    Bilateral Near:     Physical Exam Vitals and nursing note reviewed.  Constitutional:      Appearance: Normal appearance.  HENT:     Head: Normocephalic and atraumatic.     Nose: Nose normal.  Eyes:     Conjunctiva/sclera: Conjunctivae normal.  Pulmonary:     Effort: Pulmonary effort is normal.  Abdominal:     General: Bowel sounds are decreased. There is no distension.     Palpations: Abdomen is  soft. There is no shifting dullness, fluid wave, hepatomegaly, splenomegaly, mass or pulsatile mass.     Tenderness: There is no abdominal tenderness.     Comments: Non tender to palpation of abdomen.   Genitourinary:    Comments: Small soft nodule palpated in the right testicle. Non tender.  No testicle swelling, erythema.  Musculoskeletal:        General: Normal range of motion.     Cervical back: Normal range of motion.  Skin:    General: Skin is warm and dry.  Neurological:     Mental Status: He is alert.  Psychiatric:        Mood and Affect: Mood normal.      UC Treatments / Results  Labs (all labs ordered are listed, but only abnormal results are displayed) Labs Reviewed  POCT URINALYSIS DIPSTICK, ED / UC - Abnormal; Notable for the following components:      Result Value   pH 8.5 (*)    All other components within normal limits    EKG   Radiology DG Abd 1 View  Result Date: 07/27/2020 CLINICAL DATA:  Pt presents with abdominal pain right under right area; pt also complains of testicular pain X 4 days. r/o constipation EXAM: ABDOMEN - 1 VIEW COMPARISON:   None. FINDINGS: No dilated loops of large or small bowel. Moderate volume stool in the rectum. Gas and stool in the rectum. No pathologic calcifications. No organomegaly. No acute osseous abnormality IMPRESSION: No acute abdominal findings.  Moderate stool volume. Electronically Signed   By: Genevive Bi M.D.   On: 07/27/2020 13:39    Procedures Procedures (including critical care time)  Medications Ordered in UC Medications - No data to display  Initial Impression / Assessment and Plan / UC Course  I have reviewed the triage vital signs and the nursing notes.  Pertinent labs & imaging results that were available during my care of the patient were reviewed by me and considered in my medical decision making (see chart for details).     RUQ pain No acute abdomen on exam.  No concerning signs or symptoms X-ray revealed moderate stool burden.  Most likely the cause of his discomfort.   MiraLAX to treat.  Increase water intake.  Testicle lump Most likely epididymal cyst Refer to urology Final Clinical Impressions(s) / UC Diagnoses   Final diagnoses:  Constipation, unspecified constipation type  Testicle lump     Discharge Instructions     Your x ray showed a moderate amount of stool. I believe that you are constipated. We will have you do miralax daily until you get a good bowel movement and drink lots of water.   I believe that the abnormality in the testicle is most likely a cyst.  We will send you to urology for ultrasound I am also can you contact your primary care for follow-up     ED Prescriptions    None     PDMP not reviewed this encounter.   Janace Aris, NP 07/28/20 [phone]

## 2020-08-06 ENCOUNTER — Ambulatory Visit: Payer: Self-pay | Admitting: Urology

## 2022-04-13 ENCOUNTER — Ambulatory Visit (INDEPENDENT_AMBULATORY_CARE_PROVIDER_SITE_OTHER): Payer: BC Managed Care – PPO

## 2022-04-13 ENCOUNTER — Ambulatory Visit (HOSPITAL_COMMUNITY)
Admission: EM | Admit: 2022-04-13 | Discharge: 2022-04-13 | Disposition: A | Discharge status: Home / Self Care | Payer: BC Managed Care – PPO | Attending: Urgent Care | Admitting: Urgent Care

## 2022-04-13 ENCOUNTER — Encounter (HOSPITAL_COMMUNITY): Payer: Self-pay

## 2022-04-13 DIAGNOSIS — S022XXA Fracture of nasal bones, initial encounter for closed fracture: Secondary | ICD-10-CM

## 2022-04-13 NOTE — Discharge Instructions (Signed)
You do have a nasal fracture. Please ice your nose for several times a day for the next 3 days. You may take 800 mg of ibuprofen every 8 hours to help with the pain and swelling.  Please take this with food. Because the nasal bone is displaced, I have referred you to ENT.  Please follow-up with them as soon as possible.

## 2022-04-13 NOTE — ED Triage Notes (Signed)
Pt reports possible dislocation of his nose while playing basketball. Pt took 800mg  of ibuprofen to help with pain. Nose bled initially, pt does not show any signs of distress.

## 2022-04-13 NOTE — ED Provider Notes (Signed)
[facility]    CSN: [id number] Arrival date & time: 04/13/22  1524      History   Chief Complaint Chief Complaint  Patient presents with   Facial Injury    nose    HPI Jeffery Murphy is a 26 y.o. male.   Pleasant 26 year old male with no past medical history presents today due to concern of a possible broken nose.  He states at 2 PM today he was playing basketball, and got elbowed in the nose.  He states it hurt instantly, and felt "crunchy".  He denies any prior injury to his nose or prior history of fracture.  It was bleeding from the right nare initially, but he states this stopped spontaneously.  He took 800 mg of ibuprofen around 230.  He states his pain is well controlled at present time.  He is fully able to breathe out of both nostrils, although he states the right sounds "whistling".  He denies any postnasal drainage or the taste of blood.   Facial Injury   Past Medical History:  Diagnosis Date   Allergy     There are no problems to display for this patient.   Past Surgical History:  Procedure Laterality Date   COSMETIC SURGERY     jaw repair       Home Medications    Prior to Admission medications   Medication Sig Start Date End Date Taking? Authorizing Provider  fluticasone (FLONASE) 50 MCG/ACT nasal spray Place 4 sprays into the nose daily. 01/12/13 07/27/20  Carmelina Dane, MD    Family History Family History  Problem Relation Age of Onset   Diabetes Mother    Asthma Father    Asthma Brother     Social History Social History   Tobacco Use   Smoking status: Never   Smokeless tobacco: Never  Substance Use Topics   Alcohol use: No   Drug use: No     Allergies   Patient has no known allergies.   Review of Systems Review of Systems  Musculoskeletal:        Nose pain  All other systems reviewed and are negative.  As per HPI  Physical Exam Triage Vital Signs ED Triage Vitals  Enc Vitals Group     BP 04/13/22 1700  (!) 123/54     Pulse Rate 04/13/22 1700 (!) 52     Resp 04/13/22 1700 15     Temp 04/13/22 1700 98.8 F (37.1 C)     Temp Source 04/13/22 1700 Oral     SpO2 04/13/22 1700 100 %     Weight --      Height --      Head Circumference --      Peak Flow --      Pain Score 04/13/22 1702 0     Pain Loc --      Pain Edu? --      Excl. in GC? --    No data found.  Updated Vital Signs BP (!) 123/54 (BP Location: Right Arm)   Pulse (!) 52   Temp 98.8 F (37.1 C) (Oral)   Resp 15   SpO2 100%   Visual Acuity Right Eye Distance:   Left Eye Distance:   Bilateral Distance:    Right Eye Near:   Left Eye Near:    Bilateral Near:     Physical Exam Vitals and nursing note reviewed.  Constitutional:      General: He is not  in acute distress.    Appearance: Normal appearance. He is normal weight. He is not ill-appearing, toxic-appearing or diaphoretic.  HENT:     Head: Normocephalic and atraumatic.     Jaw: There is normal jaw occlusion. No trismus, tenderness, swelling, pain on movement or malocclusion.     Right Ear: External ear normal.     Left Ear: External ear normal.     Nose: Nasal deformity, septal deviation, signs of injury and nasal tenderness present. No congestion.     Right Nostril: Epistaxis present. No foreign body, septal hematoma or occlusion.     Left Nostril: No foreign body, epistaxis, septal hematoma or occlusion.     Right Turbinates: Not enlarged, swollen or pale.     Left Turbinates: Not enlarged, swollen or pale.     Right Sinus: No maxillary sinus tenderness or frontal sinus tenderness.     Left Sinus: No maxillary sinus tenderness or frontal sinus tenderness.     Comments: Slight R nasal deviation, with evidence of recent bleed, however no active bleeding.    Mouth/Throat:     Lips: Pink.     Mouth: Mucous membranes are moist. No injury or oral lesions.  Cardiovascular:     Rate and Rhythm: Normal rate.  Neurological:     Mental Status: He is alert.       UC Treatments / Results  Labs (all labs ordered are listed, but only abnormal results are displayed) Labs Reviewed - No data to display  EKG   Radiology DG Nasal Bones  Result Date: 04/13/2022 CLINICAL DATA:  basketball injury, bleeding from R nare, concern for broken nose EXAM: NASAL BONES - 3+ VIEW COMPARISON:  None Available. FINDINGS: Minimally displaced nasal bone fractures. IMPRESSION: Minimally displaced nasal bone fractures. Electronically Signed   By: Roanna Banning M.D.   On: 04/13/2022 17:39    Procedures Procedures (including critical care time)  Medications Ordered in UC Medications - No data to display  Initial Impression / Assessment and Plan / UC Course  I have reviewed the triage vital signs and the nursing notes.  Pertinent labs & imaging results that were available during my care of the patient were reviewed by me and considered in my medical decision making (see chart for details).     Displaced nasal bone fracture -patient already took ibuprofen, states that his pain is well controlled.  We will have him continue to take ibuprofen every 8 hours, ice the area several times daily.  Due to the visible deformity, we will have him follow-up with ENT.  Referral information placed on this form.  Do not play contact sports until seen by ENT.   Final Clinical Impressions(s) / UC Diagnoses   Final diagnoses:  Closed fracture of nasal bone, initial encounter     Discharge Instructions      You do have a nasal fracture. Please ice your nose for several times a day for the next 3 days. You may take 800 mg of ibuprofen every 8 hours to help with the pain and swelling.  Please take this with food. Because the nasal bone is displaced, I have referred you to ENT.  Please follow-up with them as soon as possible.     ED Prescriptions   None    PDMP not reviewed this encounter.   Maretta Bees, [place] 04/13/22 1743

## 2022-09-08 IMAGING — DX DG ABDOMEN 1V
1 series · 1 of 1 positions shown · non-contrast
Comparison: None.

CLINICAL DATA: Pt presents with abdominal pain right under right
area; pt also complains of testicular pain X 4 days. r/o
constipation

EXAM:
ABDOMEN - 1 VIEW

[abdomen kub]
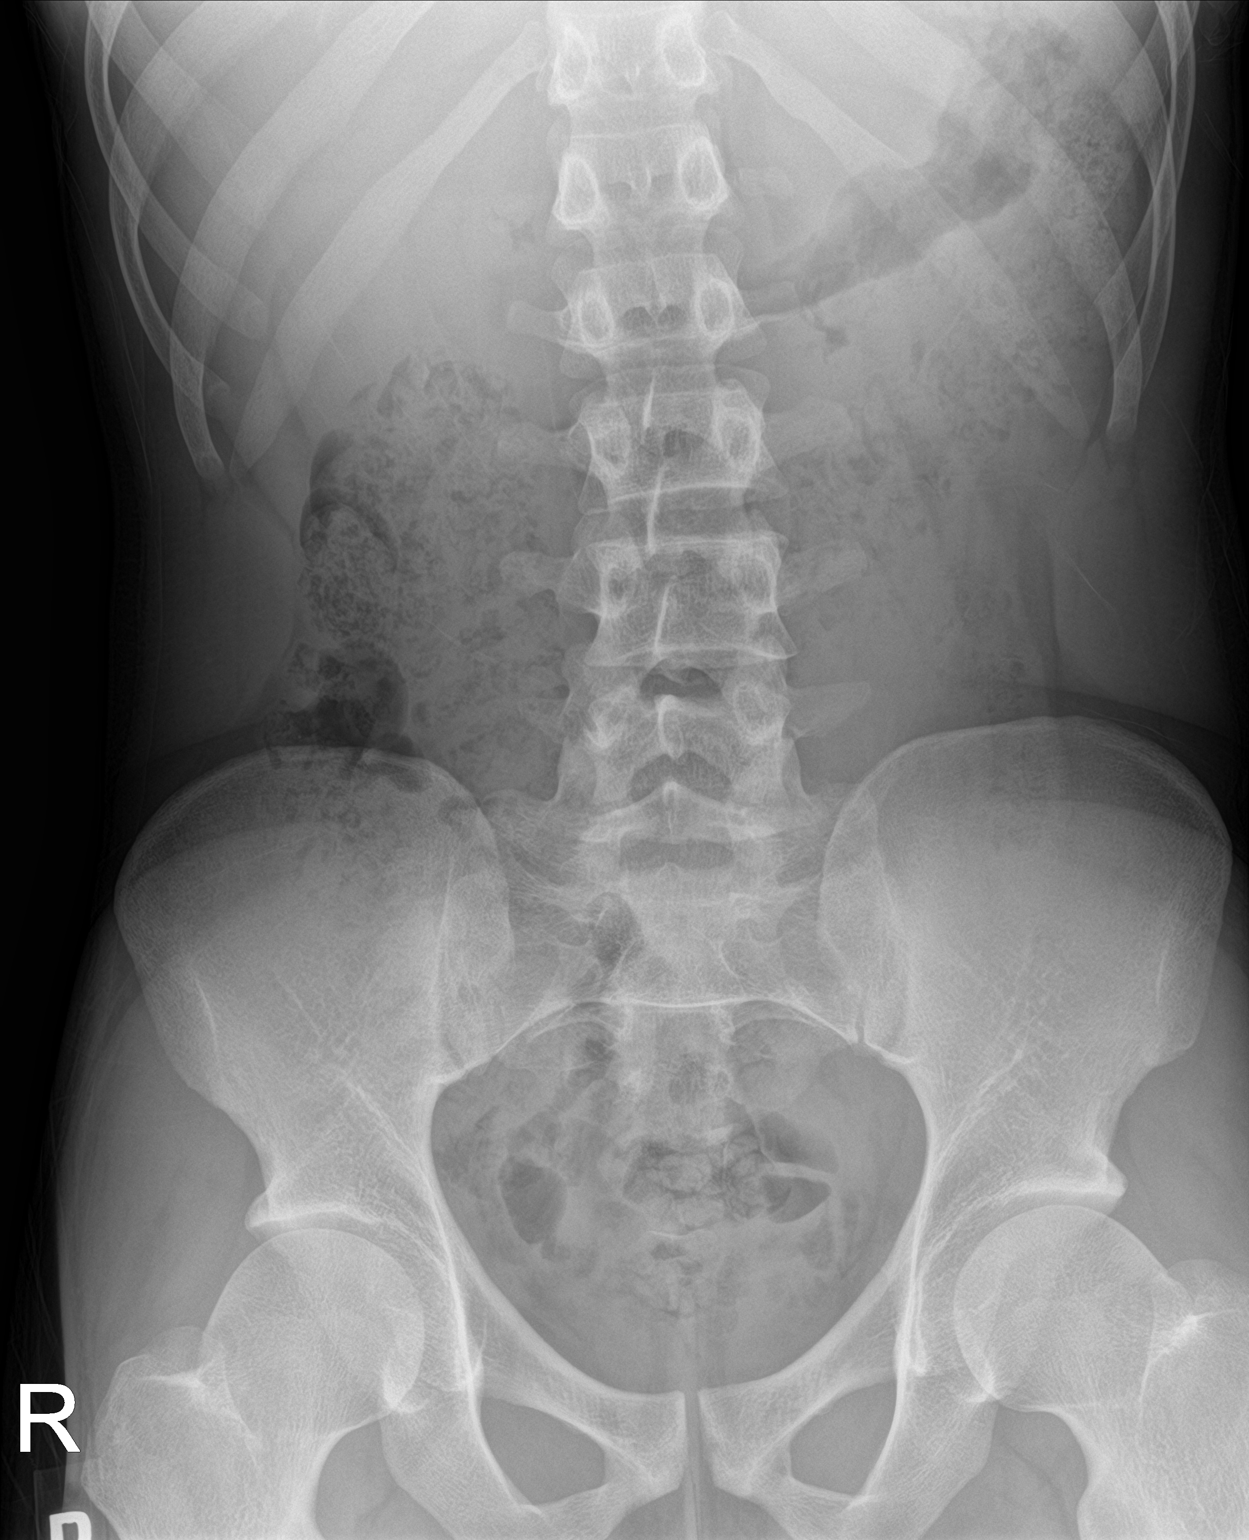

[1 of 1 positions shown; findings below may reference images not displayed]

FINDINGS: No dilated loops of large or small bowel. Moderate volume stool in
the rectum. Gas and stool in the rectum. No pathologic
calcifications. No organomegaly. No acute osseous abnormality
IMPRESSION: No acute abdominal findings.  Moderate stool volume.
